# Patient Record
Sex: Female | Born: 2005 | Hispanic: Yes | Marital: Single
Health system: Southern US, Community
[De-identification: ages and names within clinical notes are randomized; demographics above are authoritative.]

## PROBLEM LIST (undated history)

## (undated) DIAGNOSIS — F419 Anxiety disorder, unspecified: Secondary | ICD-10-CM

## (undated) DIAGNOSIS — T7840XA Allergy, unspecified, initial encounter: Secondary | ICD-10-CM

## (undated) HISTORY — PX: WISDOM TOOTH EXTRACTION: SHX21

## (undated) HISTORY — PX: ADENOIDECTOMY: SUR15

## (undated) HISTORY — PX: TONSILLECTOMY: SUR1361

---
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
## 2023-01-21 DIAGNOSIS — Z419 Encounter for procedure for purposes other than remedying health state, unspecified: Secondary | ICD-10-CM | POA: Diagnosis not present

## 2023-02-21 DIAGNOSIS — Z419 Encounter for procedure for purposes other than remedying health state, unspecified: Secondary | ICD-10-CM | POA: Diagnosis not present

## 2023-03-24 DIAGNOSIS — Z419 Encounter for procedure for purposes other than remedying health state, unspecified: Secondary | ICD-10-CM | POA: Diagnosis not present

## 2023-04-21 DIAGNOSIS — Z419 Encounter for procedure for purposes other than remedying health state, unspecified: Secondary | ICD-10-CM | POA: Diagnosis not present

## 2023-06-02 DIAGNOSIS — Z419 Encounter for procedure for purposes other than remedying health state, unspecified: Secondary | ICD-10-CM | POA: Diagnosis not present

## 2023-07-02 DIAGNOSIS — Z419 Encounter for procedure for purposes other than remedying health state, unspecified: Secondary | ICD-10-CM | POA: Diagnosis not present

## 2023-08-02 DIAGNOSIS — Z419 Encounter for procedure for purposes other than remedying health state, unspecified: Secondary | ICD-10-CM | POA: Diagnosis not present

## 2023-09-01 DIAGNOSIS — Z419 Encounter for procedure for purposes other than remedying health state, unspecified: Secondary | ICD-10-CM | POA: Diagnosis not present

## 2023-10-02 DIAGNOSIS — Z419 Encounter for procedure for purposes other than remedying health state, unspecified: Secondary | ICD-10-CM | POA: Diagnosis not present

## 2023-11-02 DIAGNOSIS — Z419 Encounter for procedure for purposes other than remedying health state, unspecified: Secondary | ICD-10-CM | POA: Diagnosis not present

## 2023-12-15 ENCOUNTER — Other Ambulatory Visit: Payer: Self-pay

## 2023-12-15 ENCOUNTER — Encounter (HOSPITAL_BASED_OUTPATIENT_CLINIC_OR_DEPARTMENT_OTHER): Payer: Self-pay | Admitting: Emergency Medicine

## 2023-12-15 ENCOUNTER — Emergency Department (HOSPITAL_BASED_OUTPATIENT_CLINIC_OR_DEPARTMENT_OTHER): Admission: EM | Admit: 2023-12-15 | Discharge: 2023-12-15 | Disposition: A | Discharge status: Home / Self Care

## 2023-12-15 DIAGNOSIS — R45851 Suicidal ideations: Secondary | ICD-10-CM | POA: Diagnosis not present

## 2023-12-15 DIAGNOSIS — D649 Anemia, unspecified: Secondary | ICD-10-CM | POA: Insufficient documentation

## 2023-12-15 DIAGNOSIS — F341 Dysthymic disorder: Secondary | ICD-10-CM | POA: Insufficient documentation

## 2023-12-15 DIAGNOSIS — F32A Depression, unspecified: Secondary | ICD-10-CM | POA: Diagnosis not present

## 2023-12-15 LAB — CBC WITH DIFFERENTIAL/PLATELET
Abs Immature Granulocytes: 0 K/uL (ref 0.00–0.07)
Basophils Absolute: 0 K/uL (ref 0.0–0.1)
Basophils Relative: 1 %
Eosinophils Absolute: 0.8 K/uL — ABNORMAL HIGH (ref 0.0–0.5)
Eosinophils Relative: 17 %
HCT: 35.7 % — ABNORMAL LOW (ref 36.0–46.0)
Hemoglobin: 11.8 g/dL — ABNORMAL LOW (ref 12.0–15.0)
Immature Granulocytes: 0 %
Lymphocytes Relative: 42 %
Lymphs Abs: 1.9 K/uL (ref 0.7–4.0)
MCH: 26.4 pg (ref 26.0–34.0)
MCHC: 33.1 g/dL (ref 30.0–36.0)
MCV: 79.9 fL — ABNORMAL LOW (ref 80.0–100.0)
Monocytes Absolute: 0.4 K/uL (ref 0.1–1.0)
Monocytes Relative: 10 %
Neutro Abs: 1.3 K/uL — ABNORMAL LOW (ref 1.7–7.7)
Neutrophils Relative %: 30 %
Platelets: 369 K/uL (ref 150–400)
RBC: 4.47 MIL/uL (ref 3.87–5.11)
RDW: 12.9 % (ref 11.5–15.5)
WBC: 4.4 K/uL (ref 4.0–10.5)
nRBC: 0 % (ref 0.0–0.2)

## 2023-12-15 LAB — BASIC METABOLIC PANEL WITH GFR
Anion gap: 11 (ref 5–15)
BUN: 10 mg/dL (ref 6–20)
CO2: 24 mmol/L (ref 22–32)
Calcium: 8.9 mg/dL (ref 8.9–10.3)
Chloride: 106 mmol/L (ref 98–111)
Creatinine, Ser: 0.6 mg/dL (ref 0.44–1.00)
GFR, Estimated: >60 mL/min (ref >60)
Glucose, Bld: 97 mg/dL (ref 70–99)
Potassium: 3.7 mmol/L (ref 3.5–5.1)
Sodium: 142 mmol/L (ref 135–145)

## 2023-12-15 LAB — HCG, SERUM, QUALITATIVE: Preg, Serum: NEGATIVE

## 2023-12-15 LAB — TSH: TSH: 1.38 u[IU]/mL (ref 0.350–4.500)

## 2023-12-15 NOTE — ED Provider Notes (Signed)
 [facility] Provider Note   CSN: [id number] Arrival date & time: 12/15/23  1607     Patient presents with: Behavioral Health Concern   Ariel Oneal is a 18 y.o. female with history of depression, presents to the ER at request of urgent care for a high depression score screening.  She states she was presenting to urgent care due to concern for ongoing fatigue and remittent wheezing since she had a cold 2 weeks ago.  She did report having a fever 2 weeks ago when she had a cold, but this has since resolved.  She reports symptoms are slowly getting better.  She denies any chest pain, shortness of breath, cough, fevers, abdominal pain, nausea or vomiting currently.  At urgent care, they did a depression screen and patient reportedly scored high on this and was told to come to the ER.  Patient currently denies any thoughts of suicidal ideation or homicidal ideation.  She does not have any active plan to harm herself or others.  She reports increased fatigue over the past couple of weeks and does have some trouble with appetite. This has been an on and off problem with her known depression.  She reports she had 2 suicide attempts when she was a child, but none recently.  She states she has removed all weapons from her household.  She states she does not want to act on the thoughts of self-harm.  She reports she has not been on medications for depression in a while, but would be open to seeing a psychiatrist.  She currently follows with a therapist.  She presents with her close friend at bedside.   HPI     Prior to Admission medications   Medication Sig Start Date End Date Taking? Authorizing Provider  gabapentin  (NEURONTIN ) 100 MG capsule Take 1 capsule (100 mg total) by mouth 2 (two) times daily. 03/27/18   Thomas, Lashunda, NP  Melatonin 3 MG TABS Take 1 tablet (3 mg total) by mouth at bedtime. 03/27/18   Thomas, Lashunda, NP  sertraline  (ZOLOFT ) 50  MG tablet Take 1 tablet (50 mg total) by mouth daily. 03/28/18   Thomas, Lashunda, NP  triamcinolone  cream (KENALOG ) 0.1 % Apply 1 application topically 2 (two) times daily. 11/20/20   Beverley Leita LABOR, PA-C    Allergies: Citrus and Vicodin hp [hydrocodone-acetaminophen ]    Review of Systems  Constitutional:  Negative for fever.  Psychiatric/Behavioral:  Negative for self-injury and suicidal ideas.     Updated Vital Signs BP (!) 146/92 (BP Location: Right Arm)   Pulse 87   Temp 98.9 F (37.2 C)   Resp 18   Ht 5' 5 (1.651 m)   Wt 103.4 kg   SpO2 100%   BMI 37.94 kg/m   Physical Exam Vitals and nursing note reviewed.  Constitutional:      General: She is not in acute distress.    Appearance: She is well-developed.  HENT:     Head: Normocephalic and atraumatic.     Mouth/Throat:     Pharynx: No oropharyngeal exudate or posterior oropharyngeal erythema.  Eyes:     Conjunctiva/sclera: Conjunctivae normal.  Cardiovascular:     Rate and Rhythm: Normal rate and regular rhythm.     Heart sounds: No murmur heard. Pulmonary:     Effort: Pulmonary effort is normal. No respiratory distress.     Breath sounds: Normal breath sounds.  Abdominal:     Palpations: Abdomen is soft.  Tenderness: There is no abdominal tenderness.  Musculoskeletal:        General: No swelling.     Cervical back: Neck supple.  Skin:    General: Skin is warm and dry.     Capillary Refill: Capillary refill takes less than 2 seconds.  Neurological:     Mental Status: She is alert.  Psychiatric:        Mood and Affect: Mood normal.        Behavior: Behavior normal.        Thought Content: Thought content normal.     Comments: Normal affect.  Thought content appropriate.     (all labs ordered are listed, but only abnormal results are displayed) Labs Reviewed  CBC WITH DIFFERENTIAL/PLATELET - Abnormal; Notable for the following components:      Result Value   Hemoglobin 11.8 (*)    HCT 35.7 (*)     MCV 79.9 (*)    Neutro Abs 1.3 (*)    Eosinophils Absolute 0.8 (*)    All other components within normal limits  BASIC METABOLIC PANEL WITH GFR  HCG, SERUM, QUALITATIVE  TSH    EKG: None  Radiology: No results found.   Procedures   Medications Ordered in the ED - No data to display                                  Medical Decision Making Amount and/or Complexity of Data Reviewed Labs: ordered.     Differential diagnosis includes but is not limited to depression, anxiety, mental illness, suicidal ideation, viral URI, pneumonia  ED Course:  Upon initial evaluation, patient is very well-appearing, no acute distress.  Stable vitals aside from mildly elevated blood pressure upon arrival at 146/92.  She comes in at the request of urgent care due to a depression screening score.  This was not when she was seen in urgent care for.  She denies any thoughts of suicidal ideation, no active plan.  No homicidal ideation.  She does report struggling with depression since she was 18 years old, and did have 2 suicide attempts when she was younger where she attempted to overdose.  She does not have any thoughts of harming herself currently and states she would not act on any thoughts.  She does report some increased fatigue and loss of appetite. Regarding the mild wheezing she originally saw in urgent care for, I do not appreciate any adventitious sounds on lung auscultation.  She is satting at 100% on room air.  No cough.  She may have lingering cough at home from the cold she had earlier, but I do not feel she needs any chest x-ray at this time given low clinical concern for pneumonia, pneumothorax, pleural effusion, or other emergent etiology.  Denies any current cough, fevers, chills, or symptoms of viral URI.  Do not feel she needs any COVID, flu, or RSV testing today.  Labs Ordered: I Ordered, and personally interpreted labs.  The pertinent results include:   CBC with mild anemia with  hemoglobin 11.8, appears to be at baseline BMP within normal limits TSH in process Pregnancy negative  Provider Suicide Risk Assessment Note  Nursing Documentation C-SSRS RISK CATEGORY: Moderate Risk  ED Suicide Bundle Interventions (Must be higher or equal to C-SSRS Score): Low Risk Interventions implemented   Suicide Risk Assessment:  Based on my clinical evaluation, I estimate the patient to be at  acute low risk of self-harm in the current setting. This decision is based on my review of the chart including patient's history and current presentation, interview of the patient, mental status examination, and consideration of suicide risk including evaluating suicidal ideation, plan, intent, suicidal or self-harm behaviors, risk factors, and protective factors.  Patient has following modifiable risk factors for suicide: current symptoms: anxiety/panic, insomnia, impulsivity, anhedonia, hopelessness Patient has following non-modifiable or demographic risk factors for suicide:history of suicide attempt Patient has the following protective factors against suicide: Access to outpatient mental health care, Supportive family, Supportive friends, and Pets in the home Mitigation: Risk for suicide is being addressed by recommendations for: outpatient consult/treatment (low risk)  Patient without any active suicidal ideation.  No signs of manic episode or other emergent mental health condition that would require monitoring in the ER or behavioral health admission.  Patient with ongoing chronic depression which she manages currently with therapy. Patient is medically cleared at this time given reassuring CBC and BMP.  Patient medically stable and appropriate for outpatient follow up with [facility]  Medications Given: None   Impression: Anemia  Depression  Disposition:  The patient was discharged home with instructions to go to the [facility] behavioral health center walk in clinic for further care  regarding her depression. She understands if she develops any active suicidal ideation, she should call 911 or go directly to the behavioral health center or nearest emergency room.  Follow-up with her PCP within the next month regarding her anemia seen on labs today.  Patient requested work note which was provided. Return precautions given and patient verbalized understanding.    Record Review: External records from outside source obtained and reviewed including urgent care note from earlier today.     This chart was dictated using voice recognition software, Dragon. Despite the best efforts of this provider to proofread and correct errors, errors may still occur which can change documentation meaning.       Final diagnoses:  Anemia, unspecified type  Persistent depressive disorder    ED Discharge Orders     None          Veta Palma, PA-C 12/15/23 1836    Neysa Caron PARAS, DO 12/15/23 2240

## 2023-12-15 NOTE — Progress Notes (Addendum)
 Assessment:     1. Depression with suicidal ideation       Plan:   1. Labs and orders from today's visit: No orders of the defined types were placed in this encounter.  Encounter Medications[1] Depression questionnaire score 16 patient admits to feelings of self-harm and ending her life in the past 2 weeks but states she has not developed a plan.  She states she was not suicidal at present .  I have explained to the patient their current symptoms and exam are concerning and are in need of further workup, outside of the scope of practice for what the Urgent Care can offer in an expedited process. This could potentially include labs, imaging, and/or IV medications. I have recommended evaluation in the Emergency Department. The patient verbalizes understanding of these concerns and is in agreement.   I have offered to notify EMS and the patient declined.  Has friend that has agreed to stay with her and drive her to hospital.  Previsit planning was completed via snapshot and review of chart.  I reviewed the patient instructions on the AVS with the patient/family verbalized to me that they understood what their problem is, what they need to do about it, and why it is important that they do it.  The patient/family voices understanding of all medications. No barriers to adherence were noted. Patient is taking all medications as prescribed and is tolerating well.  Plan for follow-up as discussed or as needed if any worsening symptoms or change in condition.  After Visit Summary was given to patient/or sent to MyChart.     Newell Niels Grew, NP  ADDENDUM:  Patient sent friend, Luciana  into clinic 4 hours  after discharge requesting work note for [employer] today.  States they couldn't find ER, then stated they went to eat and have been riding around and have not looked for ER.   Spoke with patient who is smiling, good eye contact.  NAD.    Subjective:    Ariel Oneal is a 18 y.o. female  who has a history of depression 6 years ago and took herself off medication.  She has two previous suicidal attempts from overdose, self cutting and attempt to jump off a bridge.  She also reports smoking marijuana daily but states she has decreased over the past several days . she started her menstrual cycle 4 days ago which typically worsens her Anxiety and depression.  She reports having cold symptoms 2 weeks ago and has felt tired, wheezy at times, intermittent headache.  History reviewed. No pertinent past medical history.  Allergies[2]   Social History[3]   Objective:   BP 133/84 (BP Location: Left Upper Arm, Patient Position: Sitting)   Pulse 78   Temp 98.3 F (36.8 C) (Oral)   Resp 18   Ht 5' 6 (1.676 m)   Wt 228 lb 9.6 oz (103.7 kg)   LMP 12/11/2023   SpO2 98%   Breastfeeding No   BMI 36.90 kg/m  General appearance: alert, appears stated age, cooperative and no distress Head: Normocephalic, without obvious abnormality, atraumatic Eyes: conjunctivae/corneas clear. PERRL, EOM's intact. Ears: normal TM's and external ear canals both ears Nose: Nares normal. Septum midline. Mucosa normal. No drainage or sinus tenderness. Throat: lips, mucosa, and tongue normal; teeth and gums normal Neck: no adenopathy, no carotid bruit, no JVD, supple, symmetrical, trachea midline and thyroid  not enlarged, symmetric, no tenderness/mass/nodules Lungs: clear to auscultation bilaterally Heart: regular rate and rhythm, S1, S2 normal, no  murmur, click, rub or gallop Skin: Skin color, texture, turgor normal. No rashes or lesions  See attached depression screen  No results found for this or any previous visit (from the past 14 hours).  No results found.        [1] No outpatient encounter medications on file as of 12/15/2023.   No facility-administered encounter medications on file as of 12/15/2023.  [2] Allergies Allergen Reactions  . Citrus Itching    Throat and mouth itch  .  Hydrocodone-Acetaminophen  Diarrhea  [3] Social History Socioeconomic History  . Marital status: Unknown  Tobacco Use  . Smoking status: Never  . Smokeless tobacco: Never  Vaping Use  . Vaping status: Never Used  Substance and Sexual Activity  . Alcohol use: Never  . Drug use: Yes    Frequency: 7.0 times per week    Types: Marijuana  . Sexual activity: Not Currently

## 2023-12-15 NOTE — Discharge Instructions (Addendum)
 Your blood work here showed a low hemoglobin which is one of your blood counts.  This can be caused by heavy menstrual cycles, iron deficiency, or other causes.  Please follow-up with your PCP within the next month for further evaluation to the cause of your anemia (low hemoglobin).  The remainder of your labs including your electrolytes and kidney function are normal.  Your pregnancy test is negative.  Your thyroid  testing is still in process.  Your thyroid  testing results will be available on your MyChart portal.  Please review this with your PCP at your next visit.  You may take up to 1000mg  of tylenol  every 6 hours as needed for pain/headache.  Do not take more then 4g per day.  You may use up to 600mg  ibuprofen  every 6 hours as needed for pain/headache.  Do not exceed 2.4g of ibuprofen  per day.  Please go to the [facility] walk-in clinic for further help regarding your depression symptoms. They will be able to evaluate you and provide further psychiatric assistance. They are open 24 hours a day, 7 days a week.  Their contact information and address are listed below.  If you are having active thoughts of self harm, call 911 or go directly to the behavioral health center or to the nearest ER.   Please return to the ER for any other new or concerning symptoms.

## 2023-12-15 NOTE — ED Triage Notes (Signed)
 Pt was seen at [facility] for wheezing and HA; reports they did a depression screening and I scored high, which I usually do; pt sts she usually wants to kill herself when she is menstruating; last attempt was a few years ago; denies SI currently

## 2023-12-15 NOTE — ED Notes (Signed)
 Dc instructions explained and given to pt. Pt denies SI/HI at this time. Pt verbalized understanding. Pt out of ED with friend with steady gait, not in visible distress.

## 2024-01-02 DIAGNOSIS — Z419 Encounter for procedure for purposes other than remedying health state, unspecified: Secondary | ICD-10-CM | POA: Diagnosis not present

## 2024-01-09 ENCOUNTER — Encounter: Admitting: Obstetrics and Gynecology

## 2024-02-08 DIAGNOSIS — F411 Generalized anxiety disorder: Secondary | ICD-10-CM | POA: Diagnosis not present
